# Patient Record
Sex: Female | Born: 1995 | Race: White | Hispanic: Yes | Marital: Single | Smoking: Never smoker
Health system: Southern US, Community
[De-identification: ages and names within clinical notes are randomized; demographics above are authoritative.]

## PROBLEM LIST (undated history)

## (undated) DIAGNOSIS — F99 Mental disorder, not otherwise specified: Secondary | ICD-10-CM

---
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
## 2018-10-13 ENCOUNTER — Encounter (HOSPITAL_COMMUNITY): Payer: Self-pay

## 2018-10-13 ENCOUNTER — Ambulatory Visit (INDEPENDENT_AMBULATORY_CARE_PROVIDER_SITE_OTHER): Payer: Self-pay

## 2018-10-13 ENCOUNTER — Other Ambulatory Visit: Payer: Self-pay

## 2018-10-13 ENCOUNTER — Ambulatory Visit (HOSPITAL_COMMUNITY)
Admission: EM | Admit: 2018-10-13 | Discharge: 2018-10-13 | Disposition: A | Discharge status: Home / Self Care | Payer: Self-pay | Attending: Urgent Care | Admitting: Urgent Care

## 2018-10-13 DIAGNOSIS — M79671 Pain in right foot: Secondary | ICD-10-CM

## 2018-10-13 MED ORDER — KETOROLAC TROMETHAMINE 30 MG/ML IJ SOLN
30.0000 mg | Freq: Once | INTRAMUSCULAR | Status: AC
  Administered 2018-10-13: 17:00:00 30 mg via INTRAMUSCULAR

## 2018-10-13 MED ORDER — KETOROLAC TROMETHAMINE 30 MG/ML IJ SOLN
INTRAMUSCULAR | Status: AC
  Filled 2018-10-13: qty 1

## 2018-10-13 MED ORDER — NAPROXEN 500 MG PO TABS: 500.0000 mg | ORAL_TABLET | Freq: Two times a day (BID) | ORAL | 0 refills | Status: AC

## 2018-10-13 NOTE — ED Triage Notes (Addendum)
Pt presents with complaints of right foot pain x 1 day. Denies any injury. No swelling present. Patient unable to bare weight. Denies relief with otc treatment at home.   Pt tested positive for COVID-19 the beginning of may denies any symptoms associated for 6 weeks.

## 2018-10-13 NOTE — ED Provider Notes (Signed)
  MRN: [id number] DOB: [birth year]  Subjective:   ROBERTA Mccoy is a 23 y.o. female presenting for several day history of recurrent moderate to severe right foot pain over her arch.  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  She has used ibuprofen with minimal relief.  Denies fever, redness, trauma, bony deformity, twisting her ankle or foot, bruising, warmth.  No current facility-administered medications for this encounter.   Current Outpatient Medications:  .  cetirizine (ZYRTEC) 10 MG tablet, Take 10 mg by mouth as needed for allergies., Disp: , Rfl:    No Known Allergies  Past Medical History:  Diagnosis Date  . Asthma   . Mental disorder      History reviewed. No pertinent surgical history.  ROS  Objective:   Vitals: BP 116/84   Pulse 90   Temp 98.2 F (36.8 C)   Resp 17   SpO2 99%   Physical Exam Constitutional:      General: She is not in acute distress.    Appearance: Normal appearance. She is well-developed. She is not ill-appearing.  HENT:     Head: Normocephalic and atraumatic.     Nose: Nose normal.     Mouth/Throat:     Mouth: Mucous membranes are moist.     Pharynx: Oropharynx is clear.  Eyes:     General: No scleral icterus.    Extraocular Movements: Extraocular movements intact.     Pupils: Pupils are equal, round, and reactive to light.  Cardiovascular:     Rate and Rhythm: Normal rate.  Pulmonary:     Effort: Pulmonary effort is normal.  Musculoskeletal:     Right foot: Normal range of motion and normal capillary refill. Tenderness (Exquisite tenderness over area depicted on plantar surface of arch with dorsiflexion of toes) present. No swelling, crepitus, deformity or laceration.       Feet:  Skin:    General: Skin is warm and dry.  Neurological:     General: No focal deficit present.     Mental Status: She is alert and oriented to person, place, and time.   Psychiatric:        Mood and Affect: Mood normal.        Behavior: Behavior normal.    Dg Foot Complete Right  Result Date: 10/13/2018 CLINICAL DATA:  Pain EXAM: RIGHT FOOT COMPLETE - 3+ VIEW COMPARISON:  None. FINDINGS: Frontal, oblique, and lateral views were obtained. No evident fracture or dislocation. Joint spaces appear normal. No erosive change. IMPRESSION: No fracture or dislocation.  No appreciable arthropathy. Electronically Signed   By: Lowella Grip III M.D.   On: 10/13/2018 16:37    Assessment and Plan :   1. Foot pain, right    We will manage conservatively with naproxen, modification of her activities and footwear.  Suspect that this is an inflammatory process and provided patient with information about plantar fasciitis.  X-ray was very reassuring. Counseled patient on potential for adverse effects with medications prescribed/recommended today, ER and return-to-clinic precautions discussed, patient verbalized understanding.    Jaynee Eagles, [place] 10/13/18 1644

## 2019-05-21 ENCOUNTER — Telehealth: Payer: Self-pay | Admitting: *Deleted

## 2019-05-21 NOTE — Telephone Encounter (Signed)
Employee of [place] at [facility] unit , needs copy of proof of receiving flu vaccine faxed so that she can take a Continuing Ed class on 05/26/2019. Will email to [employer] [facility] [employer] and [employer]. Fax on unit is [phone].
